# Patient Record
Sex: Female | Born: 1980 | Race: Black or African American | Hispanic: No | Marital: Single | Smoking: Current every day smoker
Health system: Southern US, Community
[De-identification: ages and names within clinical notes are randomized; demographics above are authoritative.]

## PROBLEM LIST (undated history)

## (undated) DIAGNOSIS — J45909 Unspecified asthma, uncomplicated: Secondary | ICD-10-CM

## (undated) HISTORY — PX: TUBAL LIGATION: SHX77

---

## 2019-11-08 ENCOUNTER — Emergency Department (HOSPITAL_COMMUNITY)
Admission: EM | Admit: 2019-11-08 | Discharge: 2019-11-08 | Disposition: A | Discharge status: Home / Self Care | Payer: Medicaid - Out of State | Attending: Emergency Medicine | Admitting: Emergency Medicine

## 2019-11-08 ENCOUNTER — Emergency Department (HOSPITAL_COMMUNITY): Payer: Medicaid - Out of State

## 2019-11-08 ENCOUNTER — Encounter (HOSPITAL_COMMUNITY): Payer: Self-pay | Admitting: Emergency Medicine

## 2019-11-08 ENCOUNTER — Other Ambulatory Visit: Payer: Self-pay

## 2019-11-08 DIAGNOSIS — F17210 Nicotine dependence, cigarettes, uncomplicated: Secondary | ICD-10-CM | POA: Insufficient documentation

## 2019-11-08 DIAGNOSIS — Z20828 Contact with and (suspected) exposure to other viral communicable diseases: Secondary | ICD-10-CM | POA: Diagnosis not present

## 2019-11-08 DIAGNOSIS — D259 Leiomyoma of uterus, unspecified: Secondary | ICD-10-CM | POA: Diagnosis not present

## 2019-11-08 DIAGNOSIS — J45909 Unspecified asthma, uncomplicated: Secondary | ICD-10-CM | POA: Diagnosis not present

## 2019-11-08 DIAGNOSIS — R1110 Vomiting, unspecified: Secondary | ICD-10-CM | POA: Diagnosis present

## 2019-11-08 DIAGNOSIS — R1114 Bilious vomiting: Secondary | ICD-10-CM | POA: Diagnosis not present

## 2019-11-08 HISTORY — DX: Unspecified asthma, uncomplicated: J45.909

## 2019-11-08 LAB — HCG, SERUM, QUALITATIVE: Preg, Serum: NEGATIVE

## 2019-11-08 LAB — CBC WITH DIFFERENTIAL/PLATELET
Abs Immature Granulocytes: 0.03 10*3/uL (ref 0.00–0.07)
Basophils Absolute: 0 10*3/uL (ref 0.0–0.1)
Basophils Relative: 1 %
Eosinophils Absolute: 0.1 10*3/uL (ref 0.0–0.5)
Eosinophils Relative: 2 %
HCT: 39.9 % (ref 36.0–46.0)
Hemoglobin: 13.1 g/dL (ref 12.0–15.0)
Immature Granulocytes: 0 %
Lymphocytes Relative: 16 %
Lymphs Abs: 1.3 10*3/uL (ref 0.7–4.0)
MCH: 33.2 pg (ref 26.0–34.0)
MCHC: 32.8 g/dL (ref 30.0–36.0)
MCV: 101.3 fL — ABNORMAL HIGH (ref 80.0–100.0)
Monocytes Absolute: 0.7 10*3/uL (ref 0.1–1.0)
Monocytes Relative: 8 %
Neutro Abs: 5.9 10*3/uL (ref 1.7–7.7)
Neutrophils Relative %: 73 %
Platelets: 255 10*3/uL (ref 150–400)
RBC: 3.94 MIL/uL (ref 3.87–5.11)
RDW: 13.2 % (ref 11.5–15.5)
WBC: 8 10*3/uL (ref 4.0–10.5)
nRBC: 0 % (ref 0.0–0.2)

## 2019-11-08 LAB — URINALYSIS, ROUTINE W REFLEX MICROSCOPIC
Bilirubin Urine: NEGATIVE
Glucose, UA: NEGATIVE mg/dL
Hgb urine dipstick: NEGATIVE
Ketones, ur: 5 mg/dL — AB
Leukocytes,Ua: NEGATIVE
Nitrite: NEGATIVE
Protein, ur: NEGATIVE mg/dL
Specific Gravity, Urine: 1.011 (ref 1.005–1.030)
pH: 5 (ref 5.0–8.0)

## 2019-11-08 LAB — COMPREHENSIVE METABOLIC PANEL
ALT: 18 U/L (ref 0–44)
AST: 22 U/L (ref 15–41)
Albumin: 3.8 g/dL (ref 3.5–5.0)
Alkaline Phosphatase: 48 U/L (ref 38–126)
Anion gap: 8 (ref 5–15)
BUN: 12 mg/dL (ref 6–20)
CO2: 24 mmol/L (ref 22–32)
Calcium: 8.7 mg/dL — ABNORMAL LOW (ref 8.9–10.3)
Chloride: 104 mmol/L (ref 98–111)
Creatinine, Ser: 0.8 mg/dL (ref 0.44–1.00)
GFR calc Af Amer: >60 mL/min (ref >60)
GFR calc non Af Amer: >60 mL/min (ref >60)
Glucose, Bld: 87 mg/dL (ref 70–99)
Potassium: 3.7 mmol/L (ref 3.5–5.1)
Sodium: 136 mmol/L (ref 135–145)
Total Bilirubin: 0.9 mg/dL (ref 0.3–1.2)
Total Protein: 6.9 g/dL (ref 6.5–8.1)

## 2019-11-08 LAB — LIPASE, BLOOD: Lipase: 29 U/L (ref 11–51)

## 2019-11-08 MED ORDER — IOHEXOL 300 MG/ML  SOLN
100.0000 mL | Freq: Once | INTRAMUSCULAR | Status: AC | PRN
  Administered 2019-11-08: 100 mL via INTRAVENOUS

## 2019-11-08 MED ORDER — ONDANSETRON HCL 4 MG/2ML IJ SOLN
4.0000 mg | Freq: Once | INTRAMUSCULAR | Status: AC
  Administered 2019-11-08: 4 mg via INTRAVENOUS
  Filled 2019-11-08: qty 2

## 2019-11-08 MED ORDER — PROMETHAZINE HCL 25 MG PO TABS: 25.0000 mg | ORAL_TABLET | Freq: Four times a day (QID) | ORAL | 0 refills | Status: DC | PRN

## 2019-11-08 MED ORDER — PROMETHAZINE HCL 25 MG/ML IJ SOLN
12.5000 mg | Freq: Once | INTRAMUSCULAR | Status: AC
  Administered 2019-11-08: 12.5 mg via INTRAVENOUS
  Filled 2019-11-08: qty 1

## 2019-11-08 MED ORDER — ACETAMINOPHEN 325 MG PO TABS
650.0000 mg | ORAL_TABLET | Freq: Once | ORAL | Status: AC
  Administered 2019-11-08: 650 mg via ORAL
  Filled 2019-11-08: qty 2

## 2019-11-08 MED ORDER — SODIUM CHLORIDE 0.9 % IV BOLUS
1000.0000 mL | Freq: Once | INTRAVENOUS | Status: AC
  Administered 2019-11-08: 1000 mL via INTRAVENOUS

## 2019-11-08 NOTE — ED Provider Notes (Signed)
[facility] EMERGENCY DEPARTMENT Provider Note   CSN: [id number] Arrival date & time: 11/08/19  1655     History   Chief Complaint Chief Complaint  Patient presents with  . Emesis    HPI Patricia Trevino is a 38 y.o. female.     HPI   Patricia Trevino is a 38 y.o. female who presents to the Emergency Department complaining of generalized body aches, intermittent chills and sweats, and vomiting.  Symptoms have been present for 2 days.  She reports initially developing body aches and multiple episodes of vomiting followed by laryngitis which she attributes to repeated vomiting.  She states she has been unable to keep liquids or solid food down.  No diarrhea.  She also complains of lower abdominal pain.  She notes having irregular menses this month which is unusual for her.  No known fever at home, but she did not check her temperature.  No known Covid exposures or sick family members.  Previous tubal ligation, otherwise no abdominal surgeries.    Past Medical History:  Diagnosis Date  . Asthma     There are no active problems to display for this patient.   Past Surgical History:  Procedure Laterality Date  . TUBAL LIGATION       OB History   No obstetric history on file.      Home Medications    Prior to Admission medications   Not on File    Family History History reviewed. No pertinent family history.  Social History Social History   Tobacco Use  . Smoking status: Current Every Day Smoker    Packs/day: 0.50    Types: Cigarettes  . Smokeless tobacco: Never Used  Substance Use Topics  . Alcohol use: Never    Frequency: Never  . Drug use: Never     Allergies   Aspirin and Dtap-ipv vaccine   Review of Systems Review of Systems  Constitutional: Positive for appetite change. Negative for chills and fever.  HENT: Positive for sore throat and voice change.   Respiratory: Negative for cough, chest tightness and shortness of breath.   Cardiovascular:  Negative for chest pain.  Gastrointestinal: Positive for abdominal pain, nausea and vomiting. Negative for abdominal distention and diarrhea.  Genitourinary: Negative for difficulty urinating, dysuria, flank pain and vaginal discharge.  Skin: Negative for rash.  Neurological: Negative for dizziness, weakness and numbness.     Physical Exam Updated Vital Signs BP (!) 142/81 (BP Location: Right Arm)   Pulse 64   Temp 98.8 F (37.1 C) (Oral)   Resp 18   Ht 5' (1.524 m)   Wt 72.6 kg   LMP 11/04/2019   SpO2 98%   BMI 31.25 kg/m   Physical Exam Vitals signs and nursing note reviewed.  Constitutional:      Appearance: Normal appearance. She is not toxic-appearing.     Comments: Patient is uncomfortable appearing, but nontoxic  HENT:     Mouth/Throat:     Pharynx: Oropharynx is clear. No oropharyngeal exudate or posterior oropharyngeal erythema.     Comments: Mucous membranes are slightly dry.  Uvula is midline and non-edematous, no exudates, no excessive erythema. Neck:     Musculoskeletal: Normal range of motion. No muscular tenderness.  Cardiovascular:     Rate and Rhythm: Normal rate and regular rhythm.     Pulses: Normal pulses.  Pulmonary:     Effort: Pulmonary effort is normal.     Breath sounds: Normal breath sounds.  Abdominal:  Tenderness: There is abdominal tenderness. There is no right CVA tenderness, left CVA tenderness or guarding.     Comments: Mild suprapubic tenderness on exam.  No guarding or rebound tenderness.  No tenderness of the right lower quadrant.  Musculoskeletal: Normal range of motion.  Lymphadenopathy:     Cervical: No cervical adenopathy.  Skin:    General: Skin is warm.     Capillary Refill: Capillary refill takes less than 2 seconds.     Findings: No erythema or rash.  Neurological:     General: No focal deficit present.     Mental Status: She is alert.     Sensory: No sensory deficit.     Motor: No weakness.      ED Treatments /  Results  Labs (all labs ordered are listed, but only abnormal results are displayed) Labs Reviewed  COMPREHENSIVE METABOLIC PANEL - Abnormal; Notable for the following components:      Result Value   Calcium 8.7 (*)    All other components within normal limits  CBC WITH DIFFERENTIAL/PLATELET - Abnormal; Notable for the following components:   MCV 101.3 (*)    All other components within normal limits  URINALYSIS, ROUTINE W REFLEX MICROSCOPIC - Abnormal; Notable for the following components:   APPearance HAZY (*)    Ketones, ur 5 (*)    All other components within normal limits  NOVEL CORONAVIRUS, NAA (HOSP ORDER, SEND-OUT TO REF LAB; TAT 18-24 HRS)  LIPASE, BLOOD  HCG, SERUM, QUALITATIVE    EKG None  Radiology Ct Abdomen Pelvis W Contrast  Result Date: 11/08/2019 CLINICAL DATA:  38 year old female with abdominal pain, nausea vomiting. EXAM: CT ABDOMEN AND PELVIS WITH CONTRAST TECHNIQUE: Multidetector CT imaging of the abdomen and pelvis was performed using the standard protocol following bolus administration of intravenous contrast. CONTRAST:  178mL OMNIPAQUE IOHEXOL 300 MG/ML  SOLN COMPARISON:  None. FINDINGS: Lower chest: The visualized lung bases are clear. No intra-abdominal free air or free fluid. Hepatobiliary: No focal liver abnormality is seen. No gallstones, gallbladder wall thickening, or biliary dilatation. Pancreas: Unremarkable. No pancreatic ductal dilatation or surrounding inflammatory changes. Spleen: Normal in size without focal abnormality. Adrenals/Urinary Tract: The adrenal glands are unremarkable. There is no hydronephrosis on either side. Minimal apparent enhancement of the urothelium of the proximal right ureter. Correlation with urinalysis recommended to exclude UTI. The urinary bladder is grossly unremarkable. Stomach/Bowel: Mild thickened appearance of multiple loops of proximal small bowel in the mid and upper abdomen most consistent with enteritis. Clinical  correlation is recommended. There is no bowel obstruction. The appendix is normal. Vascular/Lymphatic: The abdominal aorta and IVC are unremarkable. No portal venous gas. There is no adenopathy. Reproductive: The uterus is anteverted. Multiple uterine fibroids noted. The largest fibroid measures approximately 5.3 x 6.0 cm, exophytic from the posterior fundus. A 3.8 x 3.7 cm intramural fibroid from the anterior body demonstrates subserosal and submucosal components and causes mass effect and indentation of the lower endometrium. Trace fluid may be present within the endometrium. The ovaries are grossly unremarkable. Other: Small fat containing umbilical hernia. Musculoskeletal: No acute or significant osseous findings. IMPRESSION: 1. Findings most consistent with enteritis. Clinical correlation is recommended. No bowel obstruction. Normal appendix. 2. Fibroid uterus. 3. Minimal apparent enhancement of the urothelium of the proximal right ureter. Correlation with urinalysis recommended to exclude UTI. Electronically Signed   By: Anner Crete M.D.   On: 11/08/2019 20:11    Procedures Procedures (including critical care time)  Medications Ordered  in ED Medications  sodium chloride 0.9 % bolus 1,000 mL (0 mLs Intravenous Stopped 11/08/19 1927)  ondansetron (ZOFRAN) injection 4 mg (4 mg Intravenous Given 11/08/19 1754)  promethazine (PHENERGAN) injection 12.5 mg (12.5 mg Intravenous Given 11/08/19 1921)  acetaminophen (TYLENOL) tablet 650 mg (650 mg Oral Given 11/08/19 1926)  iohexol (OMNIPAQUE) 300 MG/ML solution 100 mL (100 mLs Intravenous Contrast Given 11/08/19 1956)     Initial Impression / Assessment and Plan / ED Course  I have reviewed the triage vital signs and the nursing notes.  Pertinent labs & imaging results that were available during my care of the patient were reviewed by me and considered in my medical decision making (see chart for details).        2030  On recheck, pt resting  comfortably.  Pain improved.  Nausea also improved.  Tolerating po fluids.  Vitals reviewed  CT abd/pelvis shows uterine fibroids and likely enteritis.  No SBO and nml appearing appendix.  Labs also reassuring.  COVID test is pending.  Pt appears appropriate for d/c home and agrees to clear liquids and out pt f/u if needed.  Also advised to f/u with GYN regarding her uterine fibroids.    Final Clinical Impressions(s) / ED Diagnoses   Final diagnoses:  Bilious vomiting with nausea  Uterine leiomyoma, unspecified location    ED Discharge Orders    None       Kem Parkinson, PA-C 11/08/19 2201    Fredia Sorrow, MD 11/09/19 2355

## 2019-11-08 NOTE — ED Triage Notes (Signed)
Pt states she has had body aches x 2 days with vomiting and has lost her voice.

## 2019-11-08 NOTE — Discharge Instructions (Addendum)
Frequent small sips of clear fluids tonight, bland diet as tolerated starting tomorrow.  Tylenol every 4 hours if needed for pain.  As discussed, your CT scan shows that you have fibroids of your uterus.  You will need to follow-up with your gynecologist regarding this.  Your Covid test is pending.  This may take 2 to 3 days for your results to return.  You will need to isolate at home until your results are back, if negative you may return to work.  If positive, you will need to isolate for 10 days.  You may follow your results on MyChart.  Return to the ER for any worsening symptoms

## 2019-11-10 LAB — NOVEL CORONAVIRUS, NAA (HOSP ORDER, SEND-OUT TO REF LAB; TAT 18-24 HRS): SARS-CoV-2, NAA: NOT DETECTED

## 2019-11-12 ENCOUNTER — Telehealth: Payer: Self-pay | Admitting: General Practice

## 2019-11-12 NOTE — Telephone Encounter (Signed)
Gave patient negative covid test results Patient understood 

## 2019-11-21 ENCOUNTER — Encounter (HOSPITAL_COMMUNITY): Payer: Self-pay | Admitting: Emergency Medicine

## 2019-11-21 ENCOUNTER — Emergency Department (HOSPITAL_COMMUNITY)
Admission: EM | Admit: 2019-11-21 | Discharge: 2019-11-21 | Disposition: A | Discharge status: Home / Self Care | Payer: Medicaid - Out of State | Attending: Emergency Medicine | Admitting: Emergency Medicine

## 2019-11-21 ENCOUNTER — Emergency Department (HOSPITAL_COMMUNITY): Payer: Medicaid - Out of State

## 2019-11-21 ENCOUNTER — Other Ambulatory Visit: Payer: Self-pay

## 2019-11-21 DIAGNOSIS — Z20828 Contact with and (suspected) exposure to other viral communicable diseases: Secondary | ICD-10-CM | POA: Insufficient documentation

## 2019-11-21 DIAGNOSIS — R45851 Suicidal ideations: Secondary | ICD-10-CM | POA: Insufficient documentation

## 2019-11-21 DIAGNOSIS — R232 Flushing: Secondary | ICD-10-CM | POA: Insufficient documentation

## 2019-11-21 DIAGNOSIS — R112 Nausea with vomiting, unspecified: Secondary | ICD-10-CM | POA: Diagnosis present

## 2019-11-21 DIAGNOSIS — F17210 Nicotine dependence, cigarettes, uncomplicated: Secondary | ICD-10-CM | POA: Insufficient documentation

## 2019-11-21 DIAGNOSIS — J45909 Unspecified asthma, uncomplicated: Secondary | ICD-10-CM | POA: Insufficient documentation

## 2019-11-21 DIAGNOSIS — F22 Delusional disorders: Secondary | ICD-10-CM | POA: Diagnosis not present

## 2019-11-21 DIAGNOSIS — F419 Anxiety disorder, unspecified: Secondary | ICD-10-CM | POA: Insufficient documentation

## 2019-11-21 DIAGNOSIS — R55 Syncope and collapse: Secondary | ICD-10-CM | POA: Diagnosis not present

## 2019-11-21 DIAGNOSIS — H5350 Unspecified color vision deficiencies: Secondary | ICD-10-CM | POA: Diagnosis not present

## 2019-11-21 DIAGNOSIS — R4689 Other symptoms and signs involving appearance and behavior: Secondary | ICD-10-CM

## 2019-11-21 DIAGNOSIS — Z046 Encounter for general psychiatric examination, requested by authority: Secondary | ICD-10-CM | POA: Insufficient documentation

## 2019-11-21 LAB — CBC
HCT: 39.7 % (ref 36.0–46.0)
Hemoglobin: 13.5 g/dL (ref 12.0–15.0)
MCH: 33.8 pg (ref 26.0–34.0)
MCHC: 34 g/dL (ref 30.0–36.0)
MCV: 99.3 fL (ref 80.0–100.0)
Platelets: 242 10*3/uL (ref 150–400)
RBC: 4 MIL/uL (ref 3.87–5.11)
RDW: 13.2 % (ref 11.5–15.5)
WBC: 9.6 10*3/uL (ref 4.0–10.5)
nRBC: 0 % (ref 0.0–0.2)

## 2019-11-21 LAB — HEPATIC FUNCTION PANEL
ALT: 15 U/L (ref 0–44)
AST: 20 U/L (ref 15–41)
Albumin: 3.8 g/dL (ref 3.5–5.0)
Alkaline Phosphatase: 48 U/L (ref 38–126)
Bilirubin, Direct: 0.1 mg/dL (ref 0.0–0.2)
Indirect Bilirubin: 1.1 mg/dL — ABNORMAL HIGH (ref 0.3–0.9)
Total Bilirubin: 1.2 mg/dL (ref 0.3–1.2)
Total Protein: 6.9 g/dL (ref 6.5–8.1)

## 2019-11-21 LAB — BASIC METABOLIC PANEL
Anion gap: 10 (ref 5–15)
BUN: 5 mg/dL — ABNORMAL LOW (ref 6–20)
CO2: 21 mmol/L — ABNORMAL LOW (ref 22–32)
Calcium: 8.9 mg/dL (ref 8.9–10.3)
Chloride: 109 mmol/L (ref 98–111)
Creatinine, Ser: 0.81 mg/dL (ref 0.44–1.00)
GFR calc Af Amer: >60 mL/min (ref >60)
GFR calc non Af Amer: >60 mL/min (ref >60)
Glucose, Bld: 101 mg/dL — ABNORMAL HIGH (ref 70–99)
Potassium: 3.7 mmol/L (ref 3.5–5.1)
Sodium: 140 mmol/L (ref 135–145)

## 2019-11-21 LAB — RAPID URINE DRUG SCREEN, HOSP PERFORMED
Amphetamines: NOT DETECTED
Barbiturates: NOT DETECTED
Benzodiazepines: POSITIVE — AB
Cocaine: NOT DETECTED
Opiates: NOT DETECTED
Tetrahydrocannabinol: POSITIVE — AB

## 2019-11-21 LAB — ETHANOL: Alcohol, Ethyl (B): <10 mg/dL (ref <10)

## 2019-11-21 LAB — LIPASE, BLOOD: Lipase: 27 U/L (ref 11–51)

## 2019-11-21 LAB — I-STAT BETA HCG BLOOD, ED (MC, WL, AP ONLY): I-stat hCG, quantitative: <5 m[IU]/mL (ref <5)

## 2019-11-21 LAB — URINALYSIS, ROUTINE W REFLEX MICROSCOPIC
Bilirubin Urine: NEGATIVE
Glucose, UA: NEGATIVE mg/dL
Hgb urine dipstick: NEGATIVE
Ketones, ur: 5 mg/dL — AB
Nitrite: NEGATIVE
Protein, ur: NEGATIVE mg/dL
Specific Gravity, Urine: 1.013 (ref 1.005–1.030)
pH: 6 (ref 5.0–8.0)

## 2019-11-21 LAB — SALICYLATE LEVEL: Salicylate Lvl: <7 mg/dL (ref 2.8–30.0)

## 2019-11-21 LAB — ACETAMINOPHEN LEVEL: Acetaminophen (Tylenol), Serum: <10 ug/mL — ABNORMAL LOW (ref 10–30)

## 2019-11-21 LAB — CBG MONITORING, ED: Glucose-Capillary: 103 mg/dL — ABNORMAL HIGH (ref 70–99)

## 2019-11-21 LAB — SARS CORONAVIRUS 2 (TAT 6-24 HRS): SARS Coronavirus 2: NEGATIVE

## 2019-11-21 MED ORDER — PROMETHAZINE HCL 25 MG PO TABS
25.0000 mg | ORAL_TABLET | Freq: Once | ORAL | Status: AC
  Administered 2019-11-21: 25 mg via ORAL
  Filled 2019-11-21: qty 1

## 2019-11-21 MED ORDER — SODIUM CHLORIDE 0.9% FLUSH: 3.0000 mL | Freq: Once | INTRAVENOUS | Status: DC

## 2019-11-21 MED ORDER — PROMETHAZINE HCL 25 MG PO TABS: 25.0000 mg | ORAL_TABLET | Freq: Three times a day (TID) | ORAL | 0 refills | Status: AC | PRN

## 2019-11-21 NOTE — ED Notes (Signed)
Patient transported to CT 

## 2019-11-21 NOTE — BH Assessment (Signed)
Assessment Note  Patricia Trevino is an 38 y.o. female that presents this date after being seen earlier in [place] ([place]) for an event associated with her passing out at a [facility] in [place]. Patient states after she was released from that facility her mother (who is present at bedside) transported her to [facility] to be evaluated for symptoms associated with paranoia that started soon after patient was discharged from [facility]. Patient denies any S/I, H/I or AVH at the time of assessment. Patient denies any thoughts of self harm. Patient denies any previous attempts or gestures at self harm or history of any mental health disorder. Mother [name]) who is present confirms patient has never attempted self harm or had any prior mental health diagnosis. Patient states she currently resides with her mother and states she feels the incident associated with the paranoia was brought on some Cannabis she had smoked earlier in the day. Patient reports sporadic use of Cannabis stating she has used that substance in the past and had not used in over three months when she smoked approximately 1/2 gram or more over the last few days. Patient denies any other SA use. Per notes patient arrives with family who states that patient has been having syncopal episodes for the past few days and hallucinating. Patient was seen at [facility] yesterday and family states they thought patient had been having seizures. Patient was discharged last night with Rx for keppra, phenergan and pantoprazole that she has not gotten filled yet. Family reports patient had another syncopal episode this am so they decided to bring her here, patient became very emotional when family attempted to leave her after she checked in. Patient's UDS was positive for cannabis this date. Patient renders conflicting history initially denying any SA use on admission. When confronted patient initially denied but then reported above use. Patient  states she resides with her mother and was afraid her mother would "lecture her about hanging around the wrong people." Patient contracts for safety and is requesting to be discharged. This Probation officer discussed case with [place] PA and Starkes FNP who recommended patient be discharged and provided with OP recourses. This Probation officer spoke with patient in reference to [place] issues and educated on use of illicit substances. Peer support was offered and patient declined. Patient was provided with OP resources and will be discharged.    Diagnosis: Substance induced mood disorder.   Past Medical History:  Past Medical History:  Diagnosis Date  . Asthma     Past Surgical History:  Procedure Laterality Date  . TUBAL LIGATION      Family History: No family history on file.  Social History:  reports that she has been smoking cigarettes. She has been smoking about 0.50 packs per day. She has never used smokeless tobacco. She reports that she does not drink alcohol or use drugs.  Additional Social History:  Alcohol / Drug Use Pain Medications: See MAR Prescriptions: See MAR Over the Counter: See MAR History of alcohol / drug use?: Yes Longest period of sobriety (when/how long): Unknown Negative Consequences of Use: (Denies) Withdrawal Symptoms: (Denies) Substance #1 Name of Substance 1: Cannabis 1 - Age of First Use: 16 1 - Amount (size/oz): Varies 1 - Frequency: Varies 1 - Duration: Varies 1 - Last Use / Amount: 11/20/19 Unknown amount  CIWA: CIWA-Ar BP: (!) 151/85 Pulse Rate: 60 COWS:    Allergies:  Allergies  Allergen Reactions  . Aspirin Other (See Comments)  seizures  . Dtap-Ipv Vaccine Hives    Home Medications: (Not in a hospital admission)   OB/GYN Status:  Patient's last menstrual period was 11/04/2019.  General Assessment Data Location of Assessment: [facility] ED TTS Assessment: In system Is this a Tele or Face-to-Face Assessment?: Face-to-Face Is this an Initial Assessment  or a Re-assessment for this encounter?: Initial Assessment Patient Accompanied by:: N/A Language Other than English: No Living Arrangements: Other (Comment) What gender do you identify as?: Female Marital status: Single Pregnancy Status: No Living Arrangements: Parent Can pt return to current living arrangement?: Yes Admission Status: Voluntary Is patient capable of signing voluntary admission?: Yes Referral Source: Self/Family/Friend Insurance type: Medicaid  Medical Screening Exam ([place]) Medical Exam completed: Yes  Crisis Care Plan Living Arrangements: Parent Legal Guardian: (NA) Name of Psychiatrist: None Name of Therapist: None  Education Status Is patient currently in school?: No Is the patient employed, unemployed or receiving disability?: Unemployed  Risk to self with the past 6 months Suicidal Ideation: No Has patient been a risk to self within the past 6 months prior to admission? : No Suicidal Intent: No Has patient had any suicidal intent within the past 6 months prior to admission? : No Is patient at risk for suicide?: No, but patient needs Medical Clearance Suicidal Plan?: No Has patient had any suicidal plan within the past 6 months prior to admission? : No Access to Means: No What has been your use of drugs/alcohol within the last 12 months?: NA Previous Attempts/Gestures: No How many times?: 0 Other Self Harm Risks: (NA) Triggers for Past Attempts: (na) Intentional Self Injurious Behavior: None Family Suicide History: No Recent stressful life event(s): (na) Persecutory voices/beliefs?: No Depression: No Depression Symptoms: (NA) Substance abuse history and/or treatment for substance abuse?: No Suicide prevention information given to non-admitted patients: Not applicable  Risk to Others within the past 6 months Homicidal Ideation: No Does patient have any lifetime risk of violence toward others beyond the six months prior to admission? :  No Thoughts of Harm to Others: No Current Homicidal Intent: No Current Homicidal Plan: No Access to Homicidal Means: No Identified Victim: NA History of harm to others?: No Assessment of Violence: None Noted Violent Behavior Description: NA Does patient have access to weapons?: No Criminal Charges Pending?: No Does patient have a court date: No Is patient on probation?: No  Psychosis Hallucinations: None noted Delusions: None noted  Mental Status Report Appearance/Hygiene: Unremarkable Eye Contact: Good Motor Activity: Freedom of movement Speech: Logical/coherent Level of Consciousness: Alert Mood: Pleasant Affect: Appropriate to circumstance Anxiety Level: Minimal Thought Processes: Coherent, Relevant Judgement: Unimpaired Orientation: Person, Place, Time Obsessive Compulsive Thoughts/Behaviors: None  Cognitive Functioning Concentration: Normal Memory: Recent Intact, Remote Intact Is patient IDD: No Insight: Good Impulse Control: Good Appetite: Good Have you had any weight changes? : No Change Sleep: No Change Total Hours of Sleep: 7 Vegetative Symptoms: None  ADLScreening [facility] Assessment Services) Patient's cognitive ability adequate to safely complete daily activities?: Yes Patient able to express need for assistance with ADLs?: Yes Independently performs ADLs?: Yes (appropriate for developmental age)  Prior Inpatient Therapy Prior Inpatient Therapy: No  Prior Outpatient Therapy Prior Outpatient Therapy: No Does patient have an ACCT team?: No Does patient have Intensive In-House Services?  : No Does patient have [facility] services? : No Does patient have P4CC services?: No  ADL Screening (condition at time of admission) Patient's cognitive ability adequate to safely complete daily activities?: Yes Is the patient deaf  or have difficulty hearing?: No Does the patient have difficulty seeing, even when wearing glasses/contacts?: No Does the patient have  difficulty concentrating, remembering, or making decisions?: No Patient able to express need for assistance with ADLs?: Yes Does the patient have difficulty dressing or bathing?: No Independently performs ADLs?: Yes (appropriate for developmental age) Does the patient have difficulty walking or climbing stairs?: No Weakness of Legs: None Weakness of Arms/Hands: None  Home Assistive Devices/Equipment Home Assistive Devices/Equipment: None  Therapy Consults (therapy consults require a physician order) PT Evaluation Needed: No OT Evalulation Needed: No SLP Evaluation Needed: No Abuse/Neglect Assessment (Assessment to be complete while patient is alone) Abuse/Neglect Assessment Can Be Completed: Yes Physical Abuse: Denies Verbal Abuse: Denies Sexual Abuse: Denies Exploitation of patient/patient's resources: Denies Self-Neglect: Denies Values / Beliefs Cultural Requests During Hospitalization: None Spiritual Requests During Hospitalization: None Consults Spiritual Care Consult Needed: No Transition of Care Team Consult Needed: No Advance Directives (For Healthcare) Does Patient Have a Medical Advance Directive?: No Would patient like information on creating a medical advance directive?: No - Patient declined          Disposition: This Probation officer discussed case with [place] PA and Starkes FNP who recommended patient be discharged and provided with OP recourses. This Probation officer spoke with patient in reference to [place] issues and educated on use of illicit substances. Peer support was offered and patient declined. Patient was provided with OP resources and will be discharged.     Disposition Initial Assessment Completed for this Encounter: Yes Disposition of Patient: Discharge  On Site Evaluation by:   Reviewed with Physician:    Mamie Nick 11/21/2019 4:42 PM

## 2019-11-21 NOTE — ED Provider Notes (Signed)
[facility] EMERGENCY DEPARTMENT Provider Note   CSN: [id number] Arrival date & time: 11/21/19  1101     History Chief Complaint  Patient presents with  . Loss of Consciousness    Patricia Trevino is a 38 y.o. female presenting for evaluation of syncope, n/v, and paranoia.   Pt states she has been having n/v for months. She feels very hot when she is nausea, and often passes out. She has been evaluated for this before with no findings. She was seen at [facility] yesterday for the same, thought to be due to seizures. She was rx'd AED, but has not picke dit up. She states she is newly paranoid, having threatening auditory hallucinations, and reports SI. She denies plan. She denies HI. She ha sno other medical problems, takes no mediations daily. She smokes 1/2 ppd cigarettes, denies etoh or drug use.   additional history obtained from chart review. Pt seen 2 wks ago for n/v. Ct scan showed uterine fibroid and enteritis.    HPI     Past Medical History:  Diagnosis Date  . Asthma     There are no problems to display for this patient.   Past Surgical History:  Procedure Laterality Date  . TUBAL LIGATION       OB History   No obstetric history on file.     No family history on file.  Social History   Tobacco Use  . Smoking status: Current Every Day Smoker    Packs/day: 0.50    Types: Cigarettes  . Smokeless tobacco: Never Used  Substance Use Topics  . Alcohol use: Never  . Drug use: Never    Home Medications Prior to Admission medications   Medication Sig Start Date End Date Taking? Authorizing Provider  promethazine (PHENERGAN) 25 MG tablet Take 1 tablet (25 mg total) by mouth every 8 (eight) hours as needed for nausea or vomiting. 11/21/19   Lella Mullany, PA-C    Allergies    Aspirin and Dtap-ipv vaccine  Review of Systems   Review of Systems  Gastrointestinal: Positive for nausea and vomiting.  Neurological: Positive for syncope.    Psychiatric/Behavioral: Positive for hallucinations and suicidal ideas. The patient is nervous/anxious.   All other systems reviewed and are negative.   Physical Exam Updated Vital Signs BP (!) 145/85   Pulse 64   Temp 98.2 F (36.8 C) (Oral)   Resp 12   LMP 11/04/2019   SpO2 100%   Physical Exam Vitals and nursing note reviewed.  Constitutional:      General: She is not in acute distress.    Appearance: She is well-developed.     Comments: Appears very paranoid and afraid.  Nontoxic  HENT:     Head: Normocephalic and atraumatic.  Eyes:     Extraocular Movements: Extraocular movements intact.     Conjunctiva/sclera: Conjunctivae normal.     Pupils: Pupils are equal, round, and reactive to light.  Cardiovascular:     Rate and Rhythm: Normal rate and regular rhythm.     Pulses: Normal pulses.  Pulmonary:     Effort: Pulmonary effort is normal. No respiratory distress.     Breath sounds: Normal breath sounds. No wheezing.  Abdominal:     General: There is no distension.     Palpations: Abdomen is soft. There is no mass.     Tenderness: There is no abdominal tenderness. There is no guarding or rebound.     Comments: No tenderness to palpation  of the abdomen.  Soft without rigidity, guarding, distention.  Negative rebound.  No signs of peritonitis.  Musculoskeletal:        General: Normal range of motion.     Cervical back: Normal range of motion and neck supple.  Skin:    General: Skin is warm and dry.     Capillary Refill: Capillary refill takes less than 2 seconds.  Neurological:     Mental Status: She is alert and oriented to person, place, and time.  Psychiatric:        Attention and Perception: She perceives auditory and visual hallucinations.        Behavior: Behavior is withdrawn.        Thought Content: Thought content includes suicidal ideation. Thought content does not include homicidal ideation. Thought content does not include homicidal or suicidal plan.      Comments: Patient withdrawn, appears afraid.  Extremely paranoid with any movement.  Reporting SI without a plan.  Reports auditory threatening hallucinations.  occasionally reporting visual color changes, but no visual hallucinations with people or figures.     ED Results / Procedures / Treatments   Labs (all labs ordered are listed, but only abnormal results are displayed) Labs Reviewed  BASIC METABOLIC PANEL - Abnormal; Notable for the following components:      Result Value   CO2 21 (*)    Glucose, Bld 101 (*)    BUN 5 (*)    All other components within normal limits  URINALYSIS, ROUTINE W REFLEX MICROSCOPIC - Abnormal; Notable for the following components:   APPearance HAZY (*)    Ketones, ur 5 (*)    Leukocytes,Ua TRACE (*)    Bacteria, UA RARE (*)    All other components within normal limits  HEPATIC FUNCTION PANEL - Abnormal; Notable for the following components:   Indirect Bilirubin 1.1 (*)    All other components within normal limits  RAPID URINE DRUG SCREEN, HOSP PERFORMED - Abnormal; Notable for the following components:   Benzodiazepines POSITIVE (*)    Tetrahydrocannabinol POSITIVE (*)    All other components within normal limits  ACETAMINOPHEN LEVEL - Abnormal; Notable for the following components:   Acetaminophen (Tylenol), Serum <10 (*)    All other components within normal limits  CBG MONITORING, ED - Abnormal; Notable for the following components:   Glucose-Capillary 103 (*)    All other components within normal limits  SARS CORONAVIRUS 2 (TAT 6-24 HRS)  CBC  LIPASE, BLOOD  SALICYLATE LEVEL  ETHANOL  I-STAT BETA HCG BLOOD, ED ([facility], [facility], [facility] ONLY)    EKG None  Radiology CT Head Wo Contrast  Result Date: 11/21/2019 CLINICAL DATA:  Syncope. Seizures. EXAM: CT HEAD WITHOUT CONTRAST TECHNIQUE: Contiguous axial images were obtained from the base of the skull through the vertex without intravenous contrast. COMPARISON:  None. FINDINGS: Brain: No evidence of  acute infarction, hemorrhage, hydrocephalus, extra-axial collection or mass lesion/mass effect. Vascular: No hyperdense vessel or unexpected calcification. Skull: Normal. Negative for fracture or focal lesion. Sinuses/Orbits: No acute finding. Other: None. IMPRESSION: Normal head CT. Electronically Signed   By: Marijo Conception M.D.   On: 11/21/2019 14:07    Procedures Procedures (including critical care time)  Medications Ordered in ED Medications  sodium chloride flush (NS) 0.9 % injection 3 mL (has no administration in time range)  promethazine (PHENERGAN) tablet 25 mg (25 mg Oral Given 11/21/19 1305)    ED Course  I have reviewed the triage vital signs  and the nursing notes.  Pertinent labs & imaging results that were available during my care of the patient were reviewed by me and considered in my medical decision making (see chart for details).    MDM Rules/Calculators/A&P                      Patient presenting for evaluation of several months of nausea, vomiting, and syncope.  Patient states when she feels nauseous, she gets very flushed and this causes her to pass out.  This is happened multiple times a day every day for the past several months.  She has been evaluated multiple times for this, including several weeks ago any pending yesterday at [facility].  Additionally, patient presenting for paranoid-like behavior and auditory hallucinations.  I will obtain labs to assess for significant dehydration, however patient does not clinically appear dehydrated.  As she had a CT scan 2 weeks ago which was normal for the same symptoms, I do not believe she needs repeat CT, as her abdominal exam is reassuring.  Doubt intra-abdominal infection, progression, obstruction, surgical abdomen.  Consider functional nausea/vomiting.  Consider cyclical vomiting/marijuana use, although patient denies marijuana use.  Consider psychiatric cause for symptoms.  Will consult with TTS, as patient's paranoia, SI,  and hallucinations are new.  Obtain CT scan to ensure no underlying medical cause.  Labs reassuring.  No significant dehydration.  Electrolytes stable.  CT head with no acute findings.  UDS pending.  On reassessment after Phenergan, patient reports nausea is improved.  At this time, patient is medically cleared for TTS evaluation.  On reassessment, patient is no longer withdrawn.  She is agitated that she has been in the ER for so long.  She is requesting to go home, but is agreeable to stay for TTS evaluation.  She is no longer reporting SI.  [facility] team evaluated the pt, is agreeable for d/c. Mom will bring pt back as needed.   At this time, pt appears safe for d/c. Return precautions given. Pt states she understands.   Final Clinical Impression(s) / ED Diagnoses Final diagnoses:  Non-intractable vomiting with nausea, unspecified vomiting type  Syncope, unspecified syncope type  Spell of abnormal behavior    Rx / DC Orders ED Discharge Orders         Ordered    promethazine (PHENERGAN) 25 MG tablet  Every 8 hours PRN     11/21/19 1605           Bryton Waight, PA-C 11/21/19 1610    Daleen Bo, MD 11/23/19 1327

## 2019-11-21 NOTE — ED Notes (Signed)
Pt's mom called out from the room, asking how much longer they have to wait until the pt is discharged from the hospital.

## 2019-11-21 NOTE — ED Triage Notes (Signed)
Pt arrives with famiily who states that patient has been having syncopal episodes for the past few days and hallucinating. Pt was seen at [facility] yesterday and family states they thought patient had been having seizures. Pt was d/c last night with rx for keppra, phenergan and pantoprazole that she has not gotten filled yet. Family reports pt had another syncopal episode this am so they decided to bring her hear, pt became very emotional when family attempted to leave her after she checked in.

## 2019-11-21 NOTE — ED Notes (Signed)
Per sabrina, ED director, 1 visitor may go back to triage with patient due to patient becoming so emotional when family attempted to leave and being unable to provide any info.

## 2019-11-21 NOTE — Discharge Instructions (Addendum)
Do not smoke marijuana, this is likely contributing to your nausea and vomiting. Use Phenergan as needed for nausea or vomiting. It is important that you follow up with the stomach doctor listed below for further evaluation of your stomach.  Return to the ER with any new, worsening, or concerning symptoms.

## 2019-11-21 NOTE — ED Notes (Signed)
Patient verbalizes understanding of discharge instructions. Opportunity for questioning and answers were provided. Armband removed by staff, pt discharged from ED.  

## 2021-07-06 ENCOUNTER — Encounter (HOSPITAL_COMMUNITY): Payer: Self-pay | Admitting: Emergency Medicine

## 2021-07-06 ENCOUNTER — Other Ambulatory Visit: Payer: Self-pay

## 2021-07-06 ENCOUNTER — Emergency Department (HOSPITAL_COMMUNITY)
Admission: EM | Admit: 2021-07-06 | Discharge: 2021-07-07 | Disposition: A | Discharge status: Home / Self Care | Payer: Medicaid - Out of State | Attending: Emergency Medicine | Admitting: Emergency Medicine

## 2021-07-06 DIAGNOSIS — R569 Unspecified convulsions: Secondary | ICD-10-CM | POA: Insufficient documentation

## 2021-07-06 DIAGNOSIS — K529 Noninfective gastroenteritis and colitis, unspecified: Secondary | ICD-10-CM | POA: Diagnosis not present

## 2021-07-06 DIAGNOSIS — R109 Unspecified abdominal pain: Secondary | ICD-10-CM | POA: Diagnosis present

## 2021-07-06 DIAGNOSIS — R1084 Generalized abdominal pain: Secondary | ICD-10-CM

## 2021-07-06 DIAGNOSIS — J45909 Unspecified asthma, uncomplicated: Secondary | ICD-10-CM | POA: Diagnosis not present

## 2021-07-06 DIAGNOSIS — F17210 Nicotine dependence, cigarettes, uncomplicated: Secondary | ICD-10-CM | POA: Insufficient documentation

## 2021-07-06 NOTE — ED Triage Notes (Signed)
Pt c/o right flank pain that radiates to the abd. Pt states she was seen in [place] Saturday and [place] last night for syncope.

## 2021-07-07 ENCOUNTER — Emergency Department (HOSPITAL_COMMUNITY): Payer: Medicaid - Out of State

## 2021-07-07 LAB — CBC WITH DIFFERENTIAL/PLATELET
Abs Immature Granulocytes: 0.01 10*3/uL (ref 0.00–0.07)
Basophils Absolute: 0 10*3/uL (ref 0.0–0.1)
Basophils Relative: 0 %
Eosinophils Absolute: 0 10*3/uL (ref 0.0–0.5)
Eosinophils Relative: 0 %
HCT: 33.5 % — ABNORMAL LOW (ref 36.0–46.0)
Hemoglobin: 11.1 g/dL — ABNORMAL LOW (ref 12.0–15.0)
Immature Granulocytes: 0 %
Lymphocytes Relative: 21 %
Lymphs Abs: 0.9 10*3/uL (ref 0.7–4.0)
MCH: 29.4 pg (ref 26.0–34.0)
MCHC: 33.1 g/dL (ref 30.0–36.0)
MCV: 88.9 fL (ref 80.0–100.0)
Monocytes Absolute: 1 10*3/uL (ref 0.1–1.0)
Monocytes Relative: 23 %
Neutro Abs: 2.4 10*3/uL (ref 1.7–7.7)
Neutrophils Relative %: 56 %
Platelets: 216 10*3/uL (ref 150–400)
RBC: 3.77 MIL/uL — ABNORMAL LOW (ref 3.87–5.11)
RDW: 19.6 % — ABNORMAL HIGH (ref 11.5–15.5)
WBC: 4.3 10*3/uL (ref 4.0–10.5)
nRBC: 0 % (ref 0.0–0.2)

## 2021-07-07 LAB — URINALYSIS, ROUTINE W REFLEX MICROSCOPIC
Bilirubin Urine: NEGATIVE
Glucose, UA: NEGATIVE mg/dL
Hgb urine dipstick: NEGATIVE
Ketones, ur: 20 mg/dL — AB
Leukocytes,Ua: NEGATIVE
Nitrite: NEGATIVE
Protein, ur: NEGATIVE mg/dL
Specific Gravity, Urine: 1.01 (ref 1.005–1.030)
pH: 6 (ref 5.0–8.0)

## 2021-07-07 LAB — BASIC METABOLIC PANEL
Anion gap: 5 (ref 5–15)
BUN: 9 mg/dL (ref 6–20)
CO2: 23 mmol/L (ref 22–32)
Calcium: 8.6 mg/dL — ABNORMAL LOW (ref 8.9–10.3)
Chloride: 108 mmol/L (ref 98–111)
Creatinine, Ser: 0.86 mg/dL (ref 0.44–1.00)
GFR, Estimated: >60 mL/min (ref >60)
Glucose, Bld: 90 mg/dL (ref 70–99)
Potassium: 3.2 mmol/L — ABNORMAL LOW (ref 3.5–5.1)
Sodium: 136 mmol/L (ref 135–145)

## 2021-07-07 LAB — PREGNANCY, URINE: Preg Test, Ur: NEGATIVE

## 2021-07-07 MED ORDER — ONDANSETRON HCL 4 MG/2ML IJ SOLN
4.0000 mg | Freq: Once | INTRAMUSCULAR | Status: AC
  Administered 2021-07-07: 4 mg via INTRAVENOUS
  Filled 2021-07-07: qty 2

## 2021-07-07 MED ORDER — SODIUM CHLORIDE 0.9 % IV BOLUS
1000.0000 mL | Freq: Once | INTRAVENOUS | Status: AC
  Administered 2021-07-07: 1000 mL via INTRAVENOUS

## 2021-07-07 MED ORDER — PROMETHAZINE HCL 25 MG/ML IJ SOLN
INTRAMUSCULAR | Status: AC
  Filled 2021-07-07: qty 1

## 2021-07-07 MED ORDER — KETOROLAC TROMETHAMINE 30 MG/ML IJ SOLN
30.0000 mg | Freq: Once | INTRAMUSCULAR | Status: AC
  Administered 2021-07-07: 30 mg via INTRAVENOUS
  Filled 2021-07-07: qty 1

## 2021-07-07 MED ORDER — SODIUM CHLORIDE 0.9 % IV SOLN
12.5000 mg | Freq: Four times a day (QID) | INTRAVENOUS | Status: DC | PRN
  Filled 2021-07-07: qty 0.5

## 2021-07-07 NOTE — ED Notes (Signed)
Patient returned from CT. IVF infusing without complication. Will continue to monitor.

## 2021-07-07 NOTE — Discharge Instructions (Addendum)
Fill the prescription for Phenergan you were given yesterday and take as directed as needed for nausea.  Return to the emergency department if you develop worsening abdominal pain, high fever, bloody stool or vomit, or other new and concerning symptoms.

## 2021-07-07 NOTE — ED Provider Notes (Signed)
[facility] EMERGENCY DEPARTMENT Provider Note   CSN: [id number] Arrival date & time: 07/06/21  2325     History Chief Complaint  Patient presents with   Abdominal Pain    Mckenzy Becerril is a 40 y.o. female.  Patient is a 40 year old female with history of asthma and tubal ligation.  Patient presenting today for evaluation of multiple complaints.  Patient seen 3 days ago at [facility] after experiencing what she describes as "8 seizures and collapsing 8 times".  I am unsure as to the work-up that was performed there, but she seemed dissatisfied with this.  Patient was seen yesterday at [facility] and underwent CT scanning, EKG, and laboratory studies, all of which are unremarkable.  Patient presents here now complaining of right flank pain radiating to the front of her abdomen.  This occurs intermittently and is significantly uncomfortable.  She denies any urinary complaints.  She denies any fevers or chills.  She does report nausea and vomiting.  The history is provided by the patient.      Past Medical History:  Diagnosis Date   Asthma     There are no problems to display for this patient.   Past Surgical History:  Procedure Laterality Date   TUBAL LIGATION       OB History   No obstetric history on file.     No family history on file.  Social History   Tobacco Use   Smoking status: Every Day    Packs/day: 0.50    Types: Cigarettes   Smokeless tobacco: Never  Vaping Use   Vaping Use: Never used  Substance Use Topics   Alcohol use: Yes    Comment: occ   Drug use: Yes    Types: Marijuana    Home Medications Prior to Admission medications   Medication Sig Start Date End Date Taking? Authorizing Provider  promethazine (PHENERGAN) 25 MG tablet Take 1 tablet (25 mg total) by mouth every 8 (eight) hours as needed for nausea or vomiting. 11/21/19   Caccavale, Sophia, PA-C    Allergies    Aspirin and Dtap-ipv vaccine  Review of Systems   Review of  Systems  All other systems reviewed and are negative.  Physical Exam Updated Vital Signs BP (!) 150/70   Pulse 60   Temp 99.3 F (37.4 C) (Oral)   Resp 18   Ht 5' (1.524 m)   Wt 72.1 kg   LMP 06/06/2021   SpO2 99%   BMI 31.05 kg/m   Physical Exam Vitals and nursing note reviewed.  Constitutional:      General: She is not in acute distress.    Appearance: She is well-developed. She is not diaphoretic.  HENT:     Head: Normocephalic and atraumatic.  Cardiovascular:     Rate and Rhythm: Normal rate and regular rhythm.     Heart sounds: No murmur heard.   No friction rub. No gallop.  Pulmonary:     Effort: Pulmonary effort is normal. No respiratory distress.     Breath sounds: Normal breath sounds. No wheezing.  Abdominal:     General: Bowel sounds are normal. There is no distension.     Palpations: Abdomen is soft.     Tenderness: There is abdominal tenderness in the right upper quadrant and right lower quadrant. There is right CVA tenderness. There is no left CVA tenderness, guarding or rebound.  Musculoskeletal:        General: Normal range of motion.  Cervical back: Normal range of motion and neck supple.  Skin:    General: Skin is warm and dry.  Neurological:     General: No focal deficit present.     Mental Status: She is alert and oriented to person, place, and time.    ED Results / Procedures / Treatments   Labs (all labs ordered are listed, but only abnormal results are displayed) Labs Reviewed - No data to display  EKG None  Radiology No results found.  Procedures Procedures   Medications Ordered in ED Medications  sodium chloride 0.9 % bolus 1,000 mL (has no administration in time range)  ondansetron (ZOFRAN) injection 4 mg (has no administration in time range)  ketorolac (TORADOL) 30 MG/ML injection 30 mg (has no administration in time range)    ED Course  I have reviewed the triage vital signs and the nursing notes.  Pertinent labs &  imaging results that were available during my care of the patient were reviewed by me and considered in my medical decision making (see chart for details).    MDM Rules/Calculators/A&P  Patient presenting here with complaints as described in the HPI, most notably right sided abdominal pain.  Patient's laboratory studies are unremarkable, but CT scan does show evidence for enteritis within the duodenum, likely viral in etiology.  Patient has no white count and CT scan is otherwise unremarkable.  Patient received IV fluids, Zofran, and Toradol and is feeling better.  At this time, patient seems appropriate for discharge with outpatient follow-up.  She was prescribed Phenergan yesterday and was advised to fill this prescription.  Final Clinical Impression(s) / ED Diagnoses Final diagnoses:  None    Rx / DC Orders ED Discharge Orders     None        Veryl Speak, MD 07/07/21 [phone]

## 2022-03-11 IMAGING — CT CT RENAL STONE PROTOCOL
2 of 4 series · 16 of 46 positions shown, 18 images · non-contrast
Comparison: 11/08/2019

CLINICAL DATA: Right flank pain

EXAM:
CT ABDOMEN AND PELVIS WITHOUT CONTRAST
TECHNIQUE: Multidetector CT imaging of the abdomen and pelvis was performed
following the standard protocol without IV contrast.

[Series 2: axial st · axial · 0.79mm/px · z∈[-506,-106]mm · 13 of 92 slices shown, 15 images]
[im 6/92  soft-tissue]
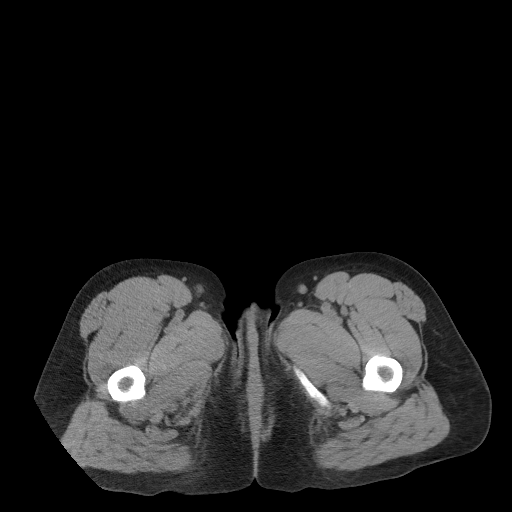
[im 6/92  bone]
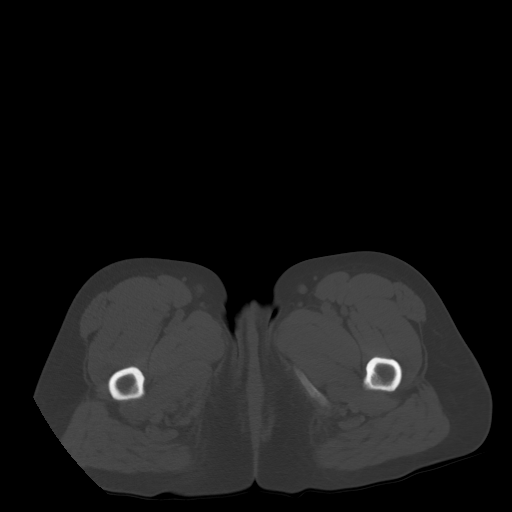
[im 11/92  soft-tissue]
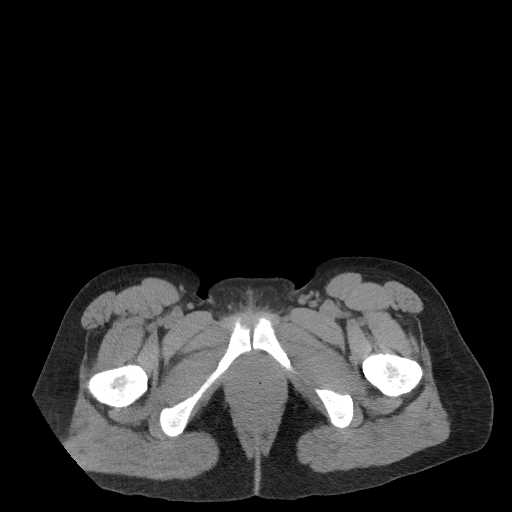
[im 21/92  soft-tissue]
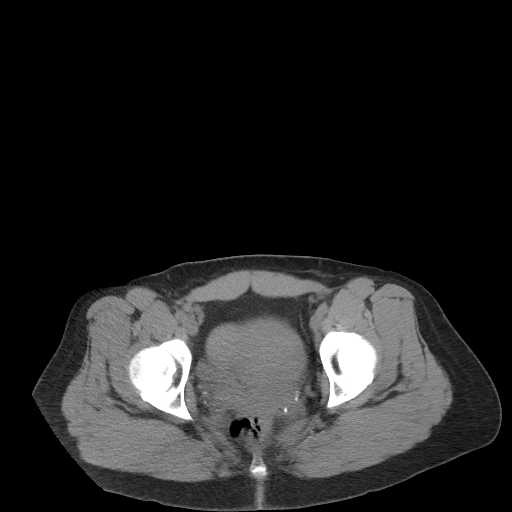
[im 26/92  soft-tissue]
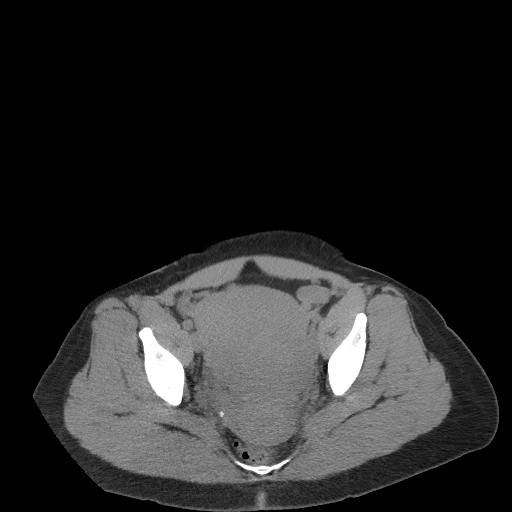
[im 31/92  soft-tissue]
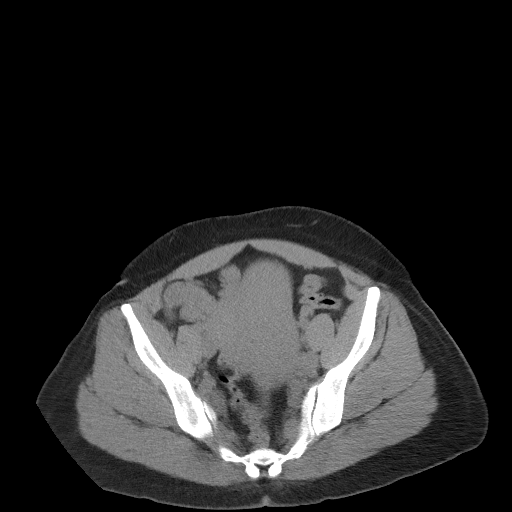
[im 41/92  soft-tissue]
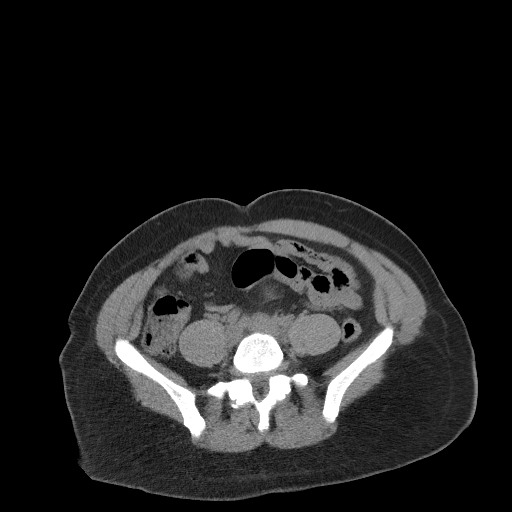
[im 46/92  soft-tissue]
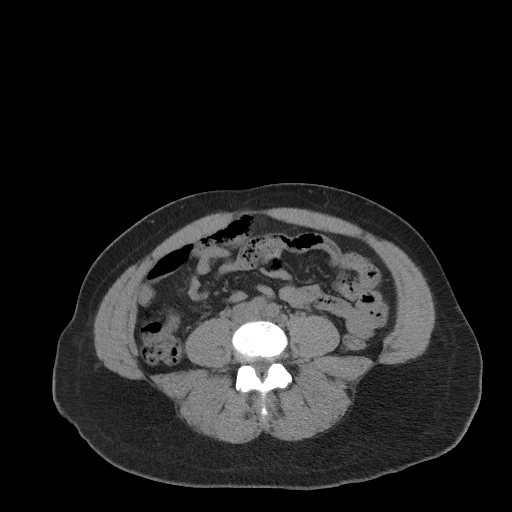
[im 51/92  soft-tissue]
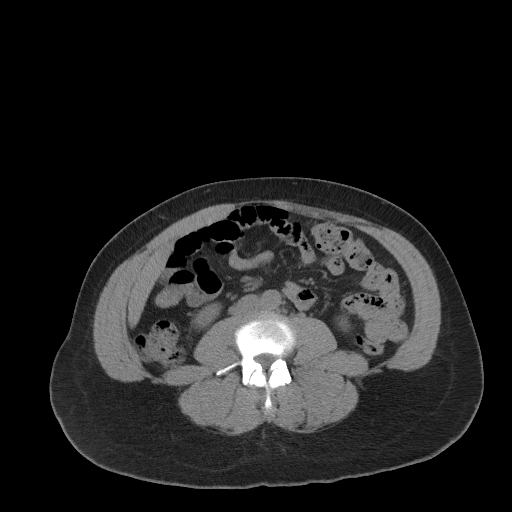
[im 61/92  soft-tissue]
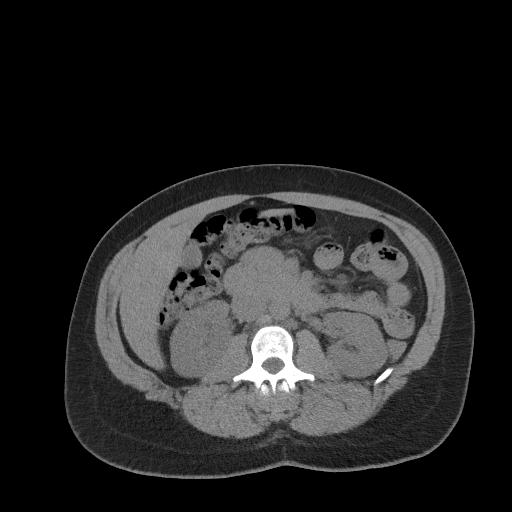
[im 61/92  bone]
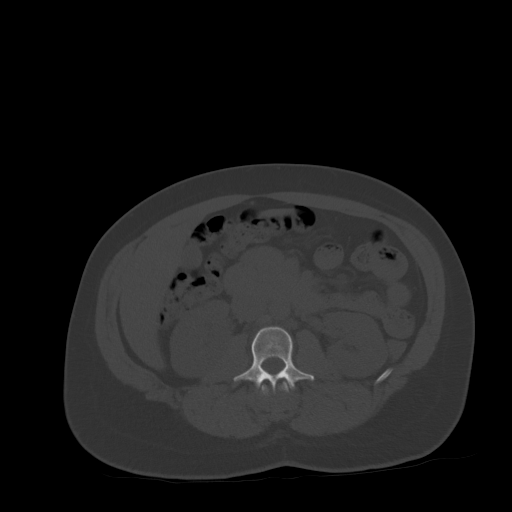
[im 66/92  soft-tissue]
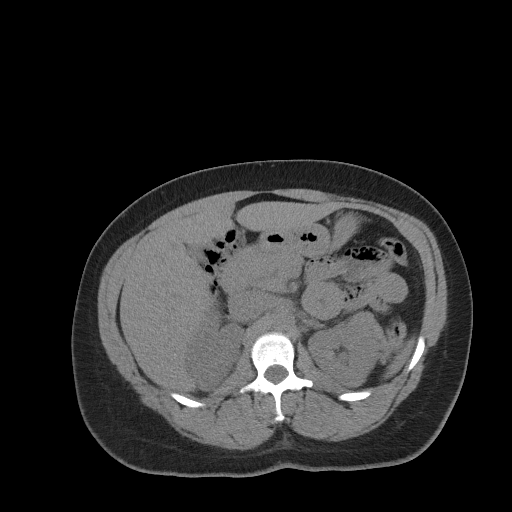
[im 71/92  soft-tissue]
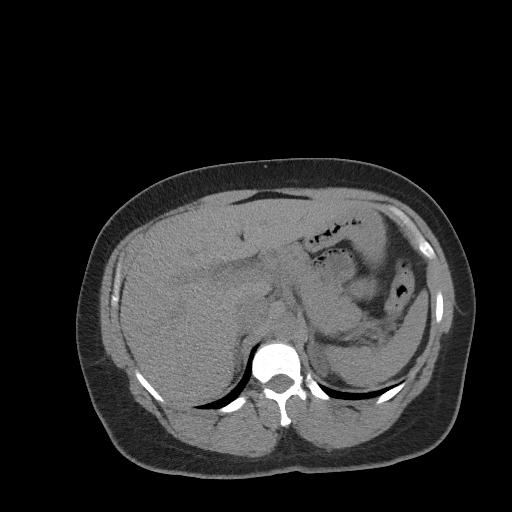
[im 81/92  soft-tissue]
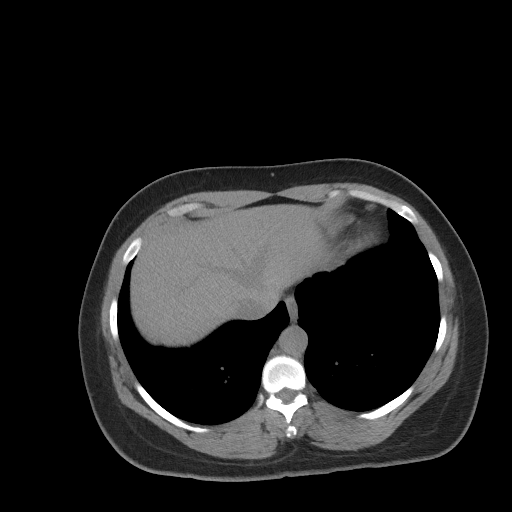
[im 86/92  soft-tissue]
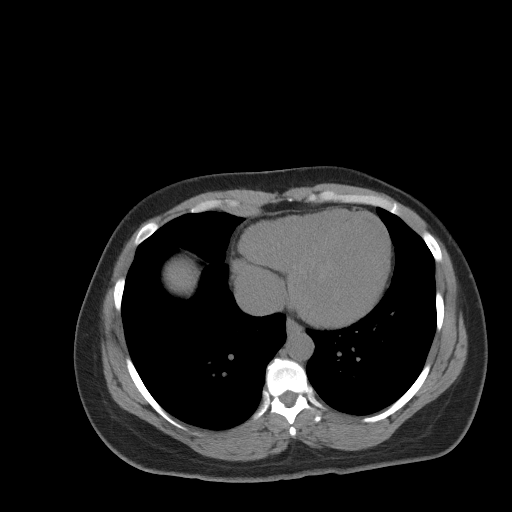

[Series 5: coronal st · coronal · 0.71mm/px · 3 of 94 slices shown]
[im 32/94  soft-tissue]
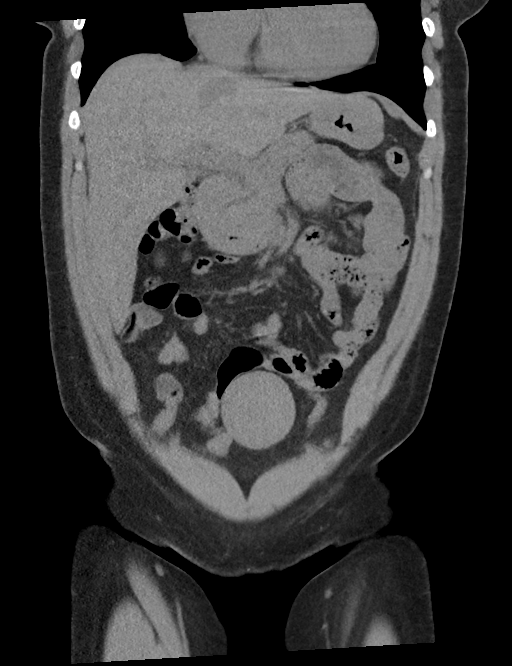
[im 42/94  soft-tissue]
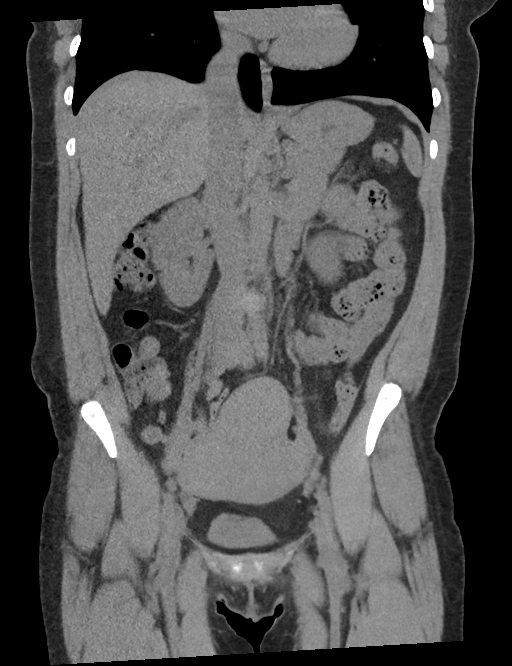
[im 52/94  soft-tissue]
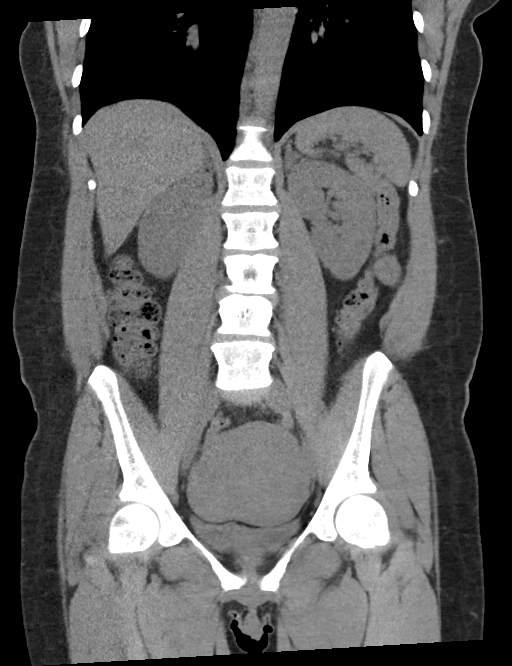

[16 of 46 positions shown; findings below may reference images not displayed]

FINDINGS: Lower chest: No acute abnormality.

Hepatobiliary: No focal liver abnormality is seen. No gallstones,
gallbladder wall thickening, or biliary dilatation.

Pancreas: Unremarkable

Spleen: Unremarkable

Adrenals/Urinary Tract: Adrenal glands are unremarkable. Kidneys are
normal, without renal calculi, focal lesion, or hydronephrosis.
Bladder is unremarkable.

Stomach/Bowel: There is circumferential thickening of the duodenum
and proximal jejunum with trace periduodenal inflammatory stranding,
best appreciated involving the third portion of the duodenum,
compatible with a infectious or inflammatory duodenitis. Similar
changes were noted on a prior examination though geographically
differing and involving the proximal to mid jejunum at that time.
There is no evidence of obstruction or perforation. The stomach,
small bowel, and large bowel are otherwise unremarkable. Appendix
normal. No free intraperitoneal gas or fluid.

Vascular/Lymphatic: No significant vascular findings are present. No
enlarged abdominal or pelvic lymph nodes.

Reproductive: The uterus is enlarged and demonstrates a lobulated
contour in keeping with underlying uterine fibroids. The adnexa are
unremarkable.

Other: Tiny fat containing umbilical hernia. The rectum is
unremarkable.

Musculoskeletal: No acute bone abnormality. Degenerative changes are
seen within the lumbar spine.
IMPRESSION: Circumferential thickening and mild surrounding inflammatory
stranding involving primarily the duodenum as well as a small
segment of jejunum just beyond the ligament of Treitz most in
keeping with an infectious or inflammatory duodenitis. No evidence
of obstruction or perforation.

Fibroid uterus.
